# Patient Record
Sex: Female | Born: 2000 | Race: White | Hispanic: No | Marital: Single | Smoking: Never smoker
Health system: Southern US, Community
[De-identification: ages and names within clinical notes are randomized; demographics above are authoritative.]

## PROBLEM LIST (undated history)

## (undated) DIAGNOSIS — J302 Other seasonal allergic rhinitis: Secondary | ICD-10-CM

## (undated) DIAGNOSIS — N390 Urinary tract infection, site not specified: Secondary | ICD-10-CM

## (undated) DIAGNOSIS — L709 Acne, unspecified: Secondary | ICD-10-CM

## (undated) HISTORY — DX: Acne, unspecified: L70.9

## (undated) HISTORY — DX: Urinary tract infection, site not specified: N39.0

## (undated) HISTORY — DX: Other seasonal allergic rhinitis: J30.2

---
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
## 2022-09-20 NOTE — Progress Notes (Deleted)
PCP:  Patient, No Pcp Per   No chief complaint on file.    HPI:      [name] is a 22 y.o. G0P0000 whose LMP was No LMP recorded. (Menstrual status: IUD)., presents today for her annual examination.  Her menses are every 3-4 months with IUD, lasting 5 days, mod flow.  Dysmenorrhea none. She has occas spotting when she thinks period is due. Hx of usually monthly menses prior to [facility] in past, occas missed a period. Hx of menometrorrhagia before BC; did xulane and depo before IUD.   Sex activity: single partner, contraception - IUD. Kyleena REplaced 07/06/20 due to malposition. Initial kyleena placed 6/21.  Last Pap: N/A due to age Hx of STDs: none  There is no FH of breast cancer. There is no FH of ovarian cancer. The patient does do self-breast exams.  Tobacco use: vapes daily Alcohol use: none No drug use.  Exercise: moderately active  She does get adequate calcium but not Vitamin D in her diet.  33# wt gain since 5/22 appt. Sx just started quickly without diet/exercise changes. Had neg labs with PCP, including DM and thyroid 9/22. Pt' s mom with PCOS; pt with hirsutism with some facial hairs.   Patient Active Problem List   Diagnosis Date Noted   Left ovarian cyst 12/30/2019   Menometrorrhagia 03/10/2018    Past Surgical History:  Procedure Laterality Date   URETERAL REIMPLANTION  2008    Family History  Problem Relation Age of Onset   Polycystic ovary syndrome Mother    Asthma Mother    Diabetes Maternal Grandfather    Hypertension Maternal Grandfather     Social History   Socioeconomic History   Marital status: Single    Spouse name: Not on file   Number of children: Not on file   Years of education: Not on file   Highest education level: Not on file  Occupational History   Not on file  Tobacco Use   Smoking status: Never   Smokeless tobacco: Never  Vaping Use   Vaping Use: Former  Substance and Sexual Activity   Alcohol use: Never   Drug  use: Never   Sexual activity: Yes    Birth control/protection: I.U.D.    Comment: Kyleena  Other Topics Concern   Not on file  Social History Narrative   Not on file   Social Determinants of Health   Financial Resource Strain: Not on file  Food Insecurity: Not on file  Transportation Needs: Not on file  Physical Activity: Not on file  Stress: Not on file  Social Connections: Not on file  Intimate Partner Violence: Not on file     Current Outpatient Medications:    levonorgestrel (KYLEENA) 19.5 MG IUD, 1 Intra Uterine Device (1 each total) by Intrauterine route once for 1 dose., Disp: 1 Intra Uterine Device, Rfl: 0     ROS:  Review of Systems  Constitutional:  Negative for fatigue, fever and unexpected weight change.  Respiratory:  Negative for cough, shortness of breath and wheezing.   Cardiovascular:  Negative for chest pain, palpitations and leg swelling.  Gastrointestinal:  Negative for blood in stool, constipation, diarrhea, nausea and vomiting.  Endocrine: Negative for cold intolerance, heat intolerance and polyuria.  Genitourinary:  Negative for dyspareunia, dysuria, flank pain, frequency, genital sores, hematuria, menstrual problem, pelvic pain, urgency, vaginal bleeding, vaginal discharge and vaginal pain.  Musculoskeletal:  Positive for arthralgias. Negative for back pain, joint swelling and  myalgias.  Skin:  Negative for rash.  Neurological:  Positive for headaches. Negative for dizziness, syncope, light-headedness and numbness.  Hematological:  Negative for adenopathy.  Psychiatric/Behavioral:  Negative for agitation, confusion, sleep disturbance and suicidal ideas. The patient is not nervous/anxious.    BREAST: No symptoms   Objective: There were no vitals taken for this visit.   Physical Exam Constitutional:      Appearance: She is well-developed.  Genitourinary:     Vulva normal.     Right Labia: No rash, tenderness or lesions.    Left Labia: No  tenderness, lesions or rash.    No vaginal discharge, erythema or tenderness.      Right Adnexa: not tender and no mass present.    Left Adnexa: not tender and no mass present.    No cervical friability or polyp.     IUD strings visualized.     Uterus is not enlarged or tender.  Breasts:    Right: No mass, nipple discharge, skin change or tenderness.     Left: No mass, nipple discharge, skin change or tenderness.  Neck:     Thyroid: No thyromegaly.  Cardiovascular:     Rate and Rhythm: Normal rate and regular rhythm.     Heart sounds: Normal heart sounds. No murmur heard. Pulmonary:     Effort: Pulmonary effort is normal.     Breath sounds: Normal breath sounds.  Abdominal:     Palpations: Abdomen is soft.     Tenderness: There is no abdominal tenderness. There is no guarding or rebound.  Musculoskeletal:        General: Normal range of motion.     Cervical back: Normal range of motion.  Lymphadenopathy:     Cervical: No cervical adenopathy.  Neurological:     General: No focal deficit present.     Mental Status: She is alert and oriented to person, place, and time.     Cranial Nerves: No cranial nerve deficit.  Skin:    General: Skin is warm and dry.  Psychiatric:        Mood and Affect: Mood normal.        Behavior: Behavior normal.        Thought Content: Thought content normal.        Judgment: Judgment normal.  Vitals reviewed.     Results: No results found for this or any previous visit (from the past 24 hour(s)).   Assessment/Plan: Encounter for annual routine gynecological examination  Screening for STD (sexually transmitted disease) - Plan: Cervicovaginal ancillary only  Encounter for routine checking of intrauterine contraceptive device (IUD) - Plan: POCT urine pregnancy; IUD strings in cx os; has 5 yr indication  Secondary oligomenorrhea--neg UPT. Pt wanted UPT. Reassurance.   Weight gain--neg labs with PCP. Most likely has PCOS given hirsutism and  FH. Not related to IUD since had initial one a year prior to sx start. Discussed low carb/keto diet to help with sx. F/u prn.             GYN counsel adequate intake of calcium and vitamin D, diet and exercise     F/U  No follow-ups on file.  [name] B. Kayn Haymore, PA-C 09/20/2022 6:48 PM

## 2022-09-21 ENCOUNTER — Ambulatory Visit: Payer: BC Managed Care – PPO | Admitting: Obstetrics and Gynecology

## 2022-09-21 DIAGNOSIS — Z30431 Encounter for routine checking of intrauterine contraceptive device: Secondary | ICD-10-CM

## 2022-09-21 DIAGNOSIS — Z113 Encounter for screening for infections with a predominantly sexual mode of transmission: Secondary | ICD-10-CM

## 2022-09-21 DIAGNOSIS — Z01419 Encounter for gynecological examination (general) (routine) without abnormal findings: Secondary | ICD-10-CM

## 2022-11-01 ENCOUNTER — Encounter: Payer: Self-pay | Admitting: Certified Nurse Midwife

## 2022-11-01 ENCOUNTER — Ambulatory Visit (INDEPENDENT_AMBULATORY_CARE_PROVIDER_SITE_OTHER): Payer: BC Managed Care – PPO | Admitting: Certified Nurse Midwife

## 2022-11-01 ENCOUNTER — Other Ambulatory Visit (HOSPITAL_COMMUNITY)
Admission: RE | Admit: 2022-11-01 | Discharge: 2022-11-01 | Disposition: A | Discharge status: Home / Self Care | Payer: BC Managed Care – PPO | Source: Ambulatory Visit

## 2022-11-01 VITALS — BP 133/89 | HR 115 | Ht 61.0 in | Wt 180.0 lb

## 2022-11-01 DIAGNOSIS — Z113 Encounter for screening for infections with a predominantly sexual mode of transmission: Secondary | ICD-10-CM | POA: Insufficient documentation

## 2022-11-01 DIAGNOSIS — Z01419 Encounter for gynecological examination (general) (routine) without abnormal findings: Secondary | ICD-10-CM

## 2022-11-01 DIAGNOSIS — Z0000 Encounter for general adult medical examination without abnormal findings: Secondary | ICD-10-CM

## 2022-11-01 DIAGNOSIS — Z975 Presence of (intrauterine) contraceptive device: Secondary | ICD-10-CM

## 2022-11-01 NOTE — Patient Instructions (Signed)

## 2022-11-01 NOTE — Progress Notes (Signed)
ANNUAL EXAM Patient name: Karen English MRN [id number]  Date of birth: 2000-06-10 Chief Complaint:   Annual Exam (Sti screening /)  History of Present Illness:   Karen English is a 22 y.o. G0P0000 Caucasian female being seen today for a routine annual exam.  Current complaints: none, desire STI screening. Kyleena in place & happy with this method of contraception.  Patient's last menstrual period was 10/31/2022.   Upstream - 11/01/22 1509       Pregnancy Intention Screening   Does the patient want to become pregnant in the next year? No    Would the patient like to discuss contraceptive options today? N/A      Contraception Wrap Up   Current Method IUD or IUS    End Method IUD or IUS            The pregnancy intention screening data noted above was reviewed. Potential methods of contraception were discussed. The patient elected to proceed with IUD or IUS.        Last pap  n/a due to age. Results were: N/A. H/O abnormal pap: no Last mammogram: n/a due to age. Results were: N/A. Family h/o breast cancer: no Last colonoscopy: n/a due to age. Results were: N/A. Family h/o colorectal cancer: no     11/01/2022    3:10 PM  Depression screen PHQ 2/9  Decreased Interest 0  Down, Depressed, Hopeless 0  PHQ - 2 Score 0        Review of Systems:   Pertinent items are noted in HPI Denies any headaches, blurred vision, fatigue, shortness of breath, chest pain, abdominal pain, abnormal vaginal discharge/itching/odor/irritation, problems with periods, bowel movements, urination, or intercourse unless otherwise stated above. Pertinent History Reviewed:  Reviewed past medical,surgical, social and family history.  Reviewed problem list, medications and allergies. Physical Assessment:   Vitals:   11/01/22 1334  BP: 133/89  Pulse: (!) 115  Weight: 180 lb (81.6 kg)  Height: 5\' 1"  (1.549 m)  Body mass index is 34.01 kg/m.       Physical Exam Vitals reviewed.   Constitutional:      Appearance: Normal appearance.  HENT:     Head: Normocephalic.  Neck:     Thyroid: No thyroid mass or thyromegaly.  Cardiovascular:     Rate and Rhythm: Normal rate and regular rhythm.     Heart sounds: Normal heart sounds.  Pulmonary:     Effort: Pulmonary effort is normal.     Breath sounds: Normal breath sounds.  Chest:  Breasts:    Tanner Score is 5.     Right: Normal.     Left: Normal.  Abdominal:     General: Abdomen is flat.     Palpations: Abdomen is soft.     Tenderness: There is no abdominal tenderness.  Genitourinary:    General: Normal vulva.     Vagina: Normal.     Cervix: Normal.     Comments: IUD strings visualized Musculoskeletal:     Cervical back: Neck supple. No tenderness.  Skin:    General: Skin is warm and dry.  Neurological:     General: No focal deficit present.     Mental Status: She is alert and oriented to person, place, and time.  Psychiatric:        Mood and Affect: Mood normal.        Behavior: Behavior normal.    No results found for this or any previous visit (from the past  24 hour(s)).  Assessment & Plan:  1. Routine screening for STI (sexually transmitted infection) - Cervicovaginal ancillary only - Declines bloodwork to screen for HIV, syphilis, HepB & HepC.  2. Routine general medical examination at a health care facility  3. Well woman exam with routine gynecological exam  Pap: due age 35 Mammogram: @ 20yo, or sooner if problems Colonoscopy: @ 20yo, or sooner if problems  No orders of the defined types were placed in this encounter.   Meds: No orders of the defined types were placed in this encounter.   Follow-up: Return in 1 year (on 11/01/2023) for Annual exam.  Dominica Severin, CNM 11/01/2022 3:10 PM

## 2022-11-05 LAB — CERVICOVAGINAL ANCILLARY ONLY
Chlamydia: NEGATIVE
Comment: NEGATIVE
Comment: NORMAL
Neisseria Gonorrhea: NEGATIVE

## 2022-12-04 ENCOUNTER — Ambulatory Visit: Payer: BC Managed Care – PPO | Admitting: Obstetrics and Gynecology
